# Patient Record
Sex: Female | Born: 1985
Health system: Southern US, Community
[De-identification: ages and names within clinical notes are randomized; demographics above are authoritative.]

---

## 2017-03-18 ENCOUNTER — Ambulatory Visit (INDEPENDENT_AMBULATORY_CARE_PROVIDER_SITE_OTHER): Payer: 59 | Admitting: Family Medicine

## 2017-03-18 ENCOUNTER — Encounter: Payer: Self-pay | Admitting: Family Medicine

## 2017-03-18 ENCOUNTER — Ambulatory Visit (INDEPENDENT_AMBULATORY_CARE_PROVIDER_SITE_OTHER): Payer: 59

## 2017-03-18 VITALS — BP 107/68 | HR 54 | Temp 98.4°F | Resp 17 | Ht 69.5 in | Wt 182.0 lb

## 2017-03-18 DIAGNOSIS — M7661 Achilles tendinitis, right leg: Secondary | ICD-10-CM | POA: Diagnosis not present

## 2017-03-18 DIAGNOSIS — H6122 Impacted cerumen, left ear: Secondary | ICD-10-CM | POA: Diagnosis not present

## 2017-03-18 DIAGNOSIS — N926 Irregular menstruation, unspecified: Secondary | ICD-10-CM | POA: Diagnosis not present

## 2017-03-18 DIAGNOSIS — Z131 Encounter for screening for diabetes mellitus: Secondary | ICD-10-CM

## 2017-03-18 DIAGNOSIS — Z13220 Encounter for screening for lipoid disorders: Secondary | ICD-10-CM

## 2017-03-18 DIAGNOSIS — Z114 Encounter for screening for human immunodeficiency virus [HIV]: Secondary | ICD-10-CM | POA: Diagnosis not present

## 2017-03-18 DIAGNOSIS — Z0000 Encounter for general adult medical examination without abnormal findings: Secondary | ICD-10-CM

## 2017-03-18 DIAGNOSIS — M79671 Pain in right foot: Secondary | ICD-10-CM | POA: Diagnosis not present

## 2017-03-18 LAB — POCT URINALYSIS DIP (MANUAL ENTRY)
BILIRUBIN UA: NEGATIVE
BILIRUBIN UA: NEGATIVE mg/dL
Blood, UA: NEGATIVE
Glucose, UA: NEGATIVE mg/dL
LEUKOCYTES UA: NEGATIVE
Nitrite, UA: NEGATIVE
PH UA: 6 (ref 5.0–8.0)
Protein Ur, POC: NEGATIVE mg/dL
Spec Grav, UA: <=1.005 — AB (ref 1.010–1.025)
Urobilinogen, UA: 0.2 E.U./dL

## 2017-03-18 MED ORDER — ETONOGESTREL-ETHINYL ESTRADIOL 0.12-0.015 MG/24HR VA RING: VAGINAL_RING | VAGINAL | 12 refills | Status: AC

## 2017-03-18 MED ORDER — MEDROXYPROGESTERONE ACETATE 10 MG PO TABS: 10.0000 mg | ORAL_TABLET | Freq: Every day | ORAL | 4 refills | Status: AC

## 2017-03-18 NOTE — Progress Notes (Signed)
Subjective:    Patient ID: Jessica Miller, female    DOB: 12/26/1985, 31 y.o.   MRN: [id number]  03/18/2017  Annual Exam   HPI This 31 y.o. female presents for Complete Physical Examination.     Last physical:  Not sure Pap smear:  [place] [facility] [place] [place]  Irregular menses; all over the place.  Last menses 02/2017 (2 days) 12/2016 (2 days), 11/2016 (2 days)    Menarche age 31.  Works out a lot; sporadic eating.  Had regular menses for four years.  Broke up with boyfriend and lost a lot of weight; no mense. OCP will regulate menses.  Provera works.  Has gained weight with birth control.  Did not mind Provera. Exercise regimen includes running 4 miles per day; weights and also crossfit; also teaches glasses; teaches group exercise.  Tries to get 20,000 steps per day.    R foot pain:  Achilles is about to kill pt.  Feels like about to rupture.  For two weeks has not been running. Started December 2018.  Talking to physical therapist; has tried everything.  This is last result.  Signed up to run half marathon in July.  Driving foot.  Really stiff every morning and then loosens up.   B:  3 hard boiled eggs, caneat them in the car.  L: salad, chicken, protein shakes D: tracks all food; chicken spaghetti, broccoli.  Ear congestion L: stopped up.  History of cerumen impaction.     Wt Readings from Last 3 Encounters:  03/31/17 179 lb (81.2 kg)  03/18/17 182 lb (82.6 kg)   BP Readings from Last 3 Encounters:  03/31/17 105/64  03/18/17 107/68    There is no immunization history on file for this patient.    Review of Systems  Constitutional: Negative for activity change, appetite change, chills, diaphoresis, fatigue, fever and unexpected weight change.  HENT: Positive for ear pain. Negative for congestion, dental problem, drooling, ear discharge, facial swelling, hearing loss, mouth sores, nosebleeds, postnasal drip, rhinorrhea, sinus pressure, sneezing, sore throat,  tinnitus, trouble swallowing and voice change.   Eyes: Negative for photophobia, pain, discharge, redness, itching and visual disturbance.  Respiratory: Negative for apnea, cough, choking, chest tightness, shortness of breath, wheezing and stridor.   Cardiovascular: Negative for chest pain, palpitations and leg swelling.  Gastrointestinal: Negative for abdominal distention, abdominal pain, anal bleeding, blood in stool, constipation, diarrhea, nausea, rectal pain and vomiting.  Endocrine: Negative for cold intolerance, heat intolerance, polydipsia, polyphagia and polyuria.  Genitourinary: Positive for menstrual problem. Negative for decreased urine volume, difficulty urinating, dyspareunia, dysuria, enuresis, flank pain, frequency, genital sores, hematuria, pelvic pain, urgency, vaginal bleeding, vaginal discharge and vaginal pain.  Musculoskeletal: Positive for arthralgias and gait problem. Negative for back pain, joint swelling, myalgias, neck pain and neck stiffness.  Skin: Negative for color change, pallor, rash and wound.  Allergic/Immunologic: Negative for environmental allergies, food allergies and immunocompromised state.  Neurological: Negative for dizziness, tremors, seizures, syncope, facial asymmetry, speech difficulty, weakness, light-headedness, numbness and headaches.  Hematological: Negative for adenopathy. Does not bruise/bleed easily.  Psychiatric/Behavioral: Negative for agitation, behavioral problems, confusion, decreased concentration, dysphoric mood, hallucinations, self-injury, sleep disturbance and suicidal ideas. The patient is nervous/anxious. The patient is not hyperactive.        Bedtime 10:00; up at 4:00am.    History reviewed. No pertinent past medical history. History reviewed. No pertinent surgical history. No Known Allergies  Social History   Social History  .  Marital status: Single    Spouse name: N/A  . Number of children: 0  . Years of education: N/A    Occupational History  . Not on file.   Social History Main Topics  . Smoking status: Never Smoker  . Smokeless tobacco: Never Used  . Alcohol use No  . Drug use: No  . Sexual activity: Not Currently   Other Topics Concern  . Not on file   Social History Narrative   Marital status: single; not dating       Children: none       Employment: physical fitness specialist at [employer]       Lives: with roommate      Tobacco: none      Alcohol: none      Drugs: none      Sexual activity: never; dates males      Exercise:  Teaches exercise classes at work; runs daily; Nurse, mental health.   Family History  Problem Relation Age of Onset  . Thyroid disease Mother   . Polycystic ovary syndrome Sister   . Hodgkin's lymphoma Paternal Grandmother   . Pancreatic cancer Paternal Grandfather        Objective:    BP 107/68 (BP Location: Right Arm, Patient Position: Sitting, Cuff Size: Normal)   Pulse (!) 54   Temp 98.4 F (36.9 C) (Oral)   Resp 17   Ht 5' 9.5" (1.765 m)   Wt 182 lb (82.6 kg)   LMP 02/07/2017   SpO2 100%   BMI 26.49 kg/m  Physical Exam  Constitutional: She is oriented to person, place, and time. She appears well-developed and well-nourished. No distress.  HENT:  Head: Normocephalic and atraumatic.  Right Ear: Tympanic membrane, external ear and ear canal normal.  Left Ear: External ear normal.  Nose: Nose normal.  Mouth/Throat: Oropharynx is clear and moist.  +cerumen L ear canal.  Eyes: Conjunctivae and EOM are normal. Pupils are equal, round, and reactive to light.  Neck: Normal range of motion and full passive range of motion without pain. Neck supple. No JVD present. Carotid bruit is not present. No thyromegaly present.  Cardiovascular: Normal rate, regular rhythm and normal heart sounds.  Exam reveals no gallop and no friction rub.   No murmur heard. Pulmonary/Chest: Effort normal and breath sounds normal. She has no wheezes. She has no rales. Right breast  exhibits no inverted nipple, no mass, no nipple discharge, no skin change and no tenderness. Left breast exhibits no mass, no nipple discharge, no skin change and no tenderness. Breasts are symmetrical.  Abdominal: Soft. Bowel sounds are normal. She exhibits no distension and no mass. There is no tenderness. There is no rebound and no guarding.  Musculoskeletal:       Right shoulder: Normal.       Left shoulder: Normal.       Right ankle: Achilles tendon exhibits pain. Achilles tendon exhibits no defect and normal Thompson's test results.       Cervical back: Normal.       Right foot: There is tenderness. There is normal range of motion, no bony tenderness, no swelling and normal capillary refill.  Lymphadenopathy:    She has no cervical adenopathy.  Neurological: She is alert and oriented to person, place, and time. She has normal reflexes. No cranial nerve deficit. She exhibits normal muscle tone. Coordination normal.  Skin: Skin is warm and dry. No rash noted. She is not diaphoretic. No erythema. No pallor.  Psychiatric: She has a normal mood and affect. Her behavior is normal. Judgment and thought content normal.  Nursing note and vitals reviewed.  Depression screen PHQ 2/9 03/18/2017  Decreased Interest 0  Down, Depressed, Hopeless 1  PHQ - 2 Score 1        Assessment & Plan:   1. Routine physical examination   2. Screening, lipid   3. Screening for diabetes mellitus   4. Screening for HIV (human immunodeficiency virus)   5. Menses, irregular   6. Achilles tendinitis of right lower extremity   7. Hearing loss due to cerumen impaction, left    -anticipatory guidance provided --- exercise, weight loss, safe driving practices, safe sexual practices. -obtain age appropriate screening labs and labs for chronic disease management. -new onset achilles tendonitis RIGHT: no improvement with rest, stretching, home exercise program; obtain R foot films; refer to sports medicine clinic due  to level of activity. -having amenorrhea in athlete with excessive exercise; expressed concern to patient; obtain labs; recommend decreasing exercise; suggested counseling and pt declined.  Denies anorexia or bulimia. -s/p LEFT ear irrigation. -irregular menses secondary to excessive exercise; obtain labs; recommend Provera monthly or Nuvaring; pt to consider.   Orders Placed This Encounter  Procedures  . DG Foot Complete Right    Standing Status:   Future    Number of Occurrences:   1    Standing Expiration Date:   03/18/2018    Order Specific Question:   Reason for Exam (SYMPTOM  OR DIAGNOSIS REQUIRED)    Answer:   R achilles pain    Order Specific Question:   Is the patient pregnant?    Answer:   No    Order Specific Question:   Preferred imaging location?    Answer:   External  . CBC with Differential/Platelet  . Comprehensive metabolic panel    Order Specific Question:   Has the patient fasted?    Answer:   Yes  . Hemoglobin A1c  . Lipid panel    Order Specific Question:   Has the patient fasted?    Answer:   Yes  . TSH  . Vitamin B12  . VITAMIN D 25 Hydroxy (Vit-D Deficiency, Fractures)  . Ambulatory referral to Sports Medicine    Referral Priority:   Routine    Referral Type:   Consultation    Number of Visits Requested:   1  . Ear wax removal    LEFT  . POCT urinalysis dipstick  . EKG 12-Lead   Meds ordered this encounter  Medications  . Multiple Vitamin (MULTI-VITAMINS) TABS    Sig: Take by mouth.  . DISCONTD: DOCOSAHEXAENOIC ACID PO    Sig: Take 1 g by mouth.  . Omega-3 Fatty Acids (FISH OIL CONCENTRATE PO)    Sig: Take by mouth daily.  . medroxyPROGESTERone (PROVERA) 10 MG tablet    Sig: Take 1 tablet (10 mg total) by mouth daily. For ten days    Dispense:  30 tablet    Refill:  4  . etonogestrel-ethinyl estradiol (NUVARING) 0.12-0.015 MG/24HR vaginal ring    Sig: Insert vaginally and leave in place for 3 consecutive weeks, then remove for 1 week.     Dispense:  1 each    Refill:  12    No Follow-up on file.   Tura Roller Paulita Fujita, M.D. Primary [facility] at [facility] [address] [place], [place]  [address] [phone] phone [phone]  fax

## 2017-03-18 NOTE — Patient Instructions (Addendum)
IF you received an x-ray today, you will receive an invoice from [facility]. Please contact [facility] at [phone] with questions or concerns regarding your invoice.   IF you received labwork today, you will receive an invoice from [facility]. Please contact [facility] at [phone] with questions or concerns regarding your invoice.   Our billing staff will not be able to assist you with questions regarding bills from these companies.  You will be contacted with the lab results as soon as they are available. The fastest way to get your results is to activate your My Chart account. Instructions are located on the last page of this paperwork. If you have not heard from [place] regarding the results in 2 weeks, please contact this office.      Achilles Tendinitis Rehab Ask your health care provider which exercises are safe for you. Do exercises exactly as told by your health care provider and adjust them as directed. It is normal to feel mild stretching, pulling, tightness, or discomfort as you do these exercises, but you should stop right away if you feel sudden pain or your pain gets worse. Do not begin these exercises until told by your health care provider. Stretching and range of motion exercises These exercises warm up your muscles and joints and improve the movement and flexibility of your ankle. These exercises also help to relieve pain, numbness, and tingling. Exercise A: Standing wall calf stretch, knee straight   1. Stand with your hands against a wall. 2. Extend your __________ leg behind you and bend your front knee slightly. Keep both of your heels on the floor. 3. Point the toes of your back foot slightly inward. 4. Keeping your heels on the floor and your back knee straight, shift your weight toward the wall. Do not allow your back to arch. You should feel a gentle stretch in your calf. 5. Hold this position for seconds. Repeat __________ times. Complete  this stretch __________ times per day. Exercise B: Standing wall calf stretch, knee bent  1. Stand with your hands against a wall. 2. Extend your __________ leg behind you, and bend your front knee slightly. Keep both of your heels on the floor. 3. Point the toes of your back foot slightly inward. 4. Keeping your heels on the floor, unlock your back knee so that it is bent. You should feel a gentle stretch deep in your calf. 5. Hold this position for __________ seconds. Repeat __________ times. Complete this stretch __________ times per day. Strengthening exercises These exercises build strength and control of your ankle. Endurance is the ability to use your muscles for a long time, even after they get tired. Exercise C: Plantar flexion with band   1. Sit on the floor with your __________ leg extended. You may put a pillow under your calf to give your foot more room to move. 2. Loop a rubber exercise band or tube around the ball of your __________ foot. The ball of your foot is on the walking surface, right under your toes. The band or tube should be slightly tense when your foot is relaxed. If the band or tube slips, you can put on your shoe or put a washcloth between the band and your foot to help it stay in place. 3. Slowly point your toes downward, pushing them away from you. 4. Hold this position for __________ seconds. 5. Slowly release the tension in the band or tube, controlling smoothly until your foot is back  to the starting position. Repeat __________ times. Complete this exercise __________ times per day. Exercise D: Heel raise with eccentric lower   1. Stand on a step with the balls of your feet. The ball of your foot is on the walking surface, right under your toes.  Do not put your heels on the step.  For balance, rest your hands on the wall or on a railing. 2. Rise up onto the balls of your feet. 3. Keeping your heels up, shift all of your weight to your __________ leg and  pick up your other leg. 4. Slowly lower your __________ leg so your heel drops below the level of the step. 5. Put down your foot. If told by your health care provider, build up to:  3 sets of 15 repetitions while keeping your knees straight.  3 sets of 15 repetitions while keeping your knees bent as far as told by your health care provider. Complete this exercise __________ times per day. If this exercise is too easy, try doing it while wearing a backpack with weights in it. Balance exercises These exercises improve or maintain your balance. Balance is important in preventing falls. Exercise E: Single leg stand  1. Without shoes, stand near a railing or in a door frame. Hold on to the railing or door frame as needed. 2. Stand on your __________ foot. Keep your big toe down on the floor and try to keep your arch lifted. 3. Hold this position for __________ seconds. Repeat __________ times. Complete this exercise __________ times per day. If this exercise is too easy, you can try it with your eyes closed or while standing on a pillow. This information is not intended to replace advice given to you by your health care provider. Make sure you discuss any questions you have with your health care provider. Document Released: 05/27/2005 Document Revised: 07/02/2016 Document Reviewed: 07/02/2015 Elsevier Interactive Patient Education  2017 [employer]

## 2017-03-19 LAB — VITAMIN B12: VITAMIN B 12: 630 pg/mL (ref 232–1245)

## 2017-03-19 LAB — COMPREHENSIVE METABOLIC PANEL
A/G RATIO: 1.8 (ref 1.2–2.2)
ALBUMIN: 4.7 g/dL (ref 3.5–5.5)
ALT: 13 IU/L (ref 0–32)
AST: 22 IU/L (ref 0–40)
Alkaline Phosphatase: 60 IU/L (ref 39–117)
BILIRUBIN TOTAL: 0.5 mg/dL (ref 0.0–1.2)
BUN / CREAT RATIO: 12 (ref 9–23)
BUN: 10 mg/dL (ref 6–20)
CHLORIDE: 101 mmol/L (ref 96–106)
CO2: 26 mmol/L (ref 18–29)
Calcium: 9.5 mg/dL (ref 8.7–10.2)
Creatinine, Ser: 0.85 mg/dL (ref 0.57–1.00)
GFR calc non Af Amer: 93 mL/min/{1.73_m2} (ref >59)
GFR, EST AFRICAN AMERICAN: 107 mL/min/{1.73_m2} (ref >59)
Globulin, Total: 2.6 g/dL (ref 1.5–4.5)
Glucose: 90 mg/dL (ref 65–99)
POTASSIUM: 5.2 mmol/L (ref 3.5–5.2)
Sodium: 140 mmol/L (ref 134–144)
TOTAL PROTEIN: 7.3 g/dL (ref 6.0–8.5)

## 2017-03-19 LAB — CBC WITH DIFFERENTIAL/PLATELET
BASOS: 0 %
Basophils Absolute: 0 10*3/uL (ref 0.0–0.2)
EOS (ABSOLUTE): 0.1 10*3/uL (ref 0.0–0.4)
Eos: 1 %
HEMOGLOBIN: 12.3 g/dL (ref 11.1–15.9)
Hematocrit: 37 % (ref 34.0–46.6)
Immature Grans (Abs): 0 10*3/uL (ref 0.0–0.1)
Immature Granulocytes: 0 %
LYMPHS: 39 %
Lymphocytes Absolute: 1.7 10*3/uL (ref 0.7–3.1)
MCH: 29.9 pg (ref 26.6–33.0)
MCHC: 33.2 g/dL (ref 31.5–35.7)
MCV: 90 fL (ref 79–97)
MONOCYTES: 6 %
Monocytes Absolute: 0.3 10*3/uL (ref 0.1–0.9)
NEUTROS ABS: 2.4 10*3/uL (ref 1.4–7.0)
Neutrophils: 54 %
Platelets: 268 10*3/uL (ref 150–379)
RBC: 4.12 x10E6/uL (ref 3.77–5.28)
RDW: 12.9 % (ref 12.3–15.4)
WBC: 4.5 10*3/uL (ref 3.4–10.8)

## 2017-03-19 LAB — HEMOGLOBIN A1C
Est. average glucose Bld gHb Est-mCnc: 103 mg/dL
Hgb A1c MFr Bld: 5.2 % (ref 4.8–5.6)

## 2017-03-19 LAB — LIPID PANEL
CHOL/HDL RATIO: 2.6 ratio (ref 0.0–4.4)
Cholesterol, Total: 214 mg/dL — ABNORMAL HIGH (ref 100–199)
HDL: 82 mg/dL (ref >39)
LDL CALC: 120 mg/dL — AB (ref 0–99)
Triglycerides: 59 mg/dL (ref 0–149)
VLDL Cholesterol Cal: 12 mg/dL (ref 5–40)

## 2017-03-19 LAB — VITAMIN D 25 HYDROXY (VIT D DEFICIENCY, FRACTURES): Vit D, 25-Hydroxy: 36.1 ng/mL (ref 30.0–100.0)

## 2017-03-19 LAB — TSH: TSH: 1.64 u[IU]/mL (ref 0.450–4.500)

## 2017-03-31 ENCOUNTER — Ambulatory Visit: Payer: Self-pay

## 2017-03-31 ENCOUNTER — Encounter: Payer: Self-pay | Admitting: Student

## 2017-03-31 ENCOUNTER — Ambulatory Visit (INDEPENDENT_AMBULATORY_CARE_PROVIDER_SITE_OTHER): Payer: 59 | Admitting: Student

## 2017-03-31 VITALS — BP 105/64 | Ht 70.0 in | Wt 179.0 lb

## 2017-03-31 DIAGNOSIS — M7660 Achilles tendinitis, unspecified leg: Secondary | ICD-10-CM

## 2017-03-31 DIAGNOSIS — S86011A Strain of right Achilles tendon, initial encounter: Secondary | ICD-10-CM

## 2017-03-31 MED ORDER — NITROGLYCERIN 0.2 MG/HR TD PT24: MEDICATED_PATCH | TRANSDERMAL | 1 refills | Status: AC

## 2017-03-31 MED FILL — NITROGLYCERIN 0.2 MG/HR PTC: 0.2 | 90 days supply | Qty: 22 | Fill #0

## 2017-03-31 NOTE — Progress Notes (Signed)
  Jessica Miller - 31 y.o. female MRN [id number]  Date of birth: 09/04/1986  SUBJECTIVE:  Including CC & ROS.  CC: right achilles pain  [place] presents with right Achilles pain that has been ongoing since December.  She is trying to train for a half marathon but has pain when she runs.  She has some physical therapist friend who recommended exercises. She reports mild improvement but really not that much. 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 She also has done cross fit.  She would like to know what to do so that and potentially run this half-marathon in July. She denies any acute injuries. Denies any numbness or tingling distally.   ROS: No unexpected weight loss, fever, chills, swelling, instability, muscle pain, numbness/tingling, redness, otherwise see HPI   PMHx - Updated and reviewed.  Contributory factors include: Negative PSHx - Updated and reviewed.  Contributory factors include:  Negative FHx - Updated and reviewed.  Contributory factors include:  Negative Social Hx - Updated and reviewed. 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 Sensation: intact Vascular: intact w/ dorsalis pedis & posterior tibialis pulses 2+ Stable lateral and medial ligaments; Negative Anterior drawer test  Limited ultrasound of right Achilles Achilles tendon viewed and long and short axis which shows partial tearing making up 15% of tendon No complete tear is seen Increased blood flow was seen to the partial tear Achilles tendon measure 0.6 cm 2 cm from the origin  Findings consistent with partial tear of right Achilles tendon with healing  Ultrasound performed and interpreted by Cardell PeachAlicia Dyasia Firestine, DO   ASSESSMENT & PLAN:   Partial Achilles tendon tear, left, initial encounter Recommended continuing home exercise program with [employer] and stretching Nitroglycerin patches daily Heel lifts given to current inserts Follow-up 3 weeks Recommend rest from running and ballistics for 3 weeks

## 2017-03-31 NOTE — Assessment & Plan Note (Signed)
Recommended continuing home exercise program with [employer] and stretching Nitroglycerin patches daily Heel lifts given to current inserts Follow-up 3 weeks Recommend rest from running and ballistics for 3 weeks

## 2017-04-05 ENCOUNTER — Encounter: Payer: Self-pay | Admitting: Family Medicine

## 2017-04-21 ENCOUNTER — Ambulatory Visit: Payer: 59 | Admitting: Student

## 2017-04-28 ENCOUNTER — Ambulatory Visit: Payer: 59 | Admitting: Student

## 2017-10-28 ENCOUNTER — Other Ambulatory Visit (HOSPITAL_COMMUNITY): Payer: Self-pay | Admitting: Research Study

## 2017-10-28 DIAGNOSIS — Z006 Encounter for examination for normal comparison and control in clinical research program: Secondary | ICD-10-CM

## 2017-10-29 ENCOUNTER — Encounter (HOSPITAL_COMMUNITY): Payer: Self-pay

## 2017-10-29 ENCOUNTER — Ambulatory Visit (HOSPITAL_COMMUNITY): Payer: 59

## 2018-03-25 ENCOUNTER — Encounter: Payer: Self-pay | Admitting: Family Medicine

## 2018-03-30 ENCOUNTER — Encounter: Payer: Self-pay | Admitting: Family Medicine

## 2018-10-20 IMAGING — DX DG FOOT COMPLETE 3+V*R*
3 series · 3 of 3 positions shown · non-contrast
Comparison: None.

CLINICAL DATA: Right-sided Achilles pain.

EXAM:
RIGHT FOOT COMPLETE - 3+ VIEW

[foot ap]
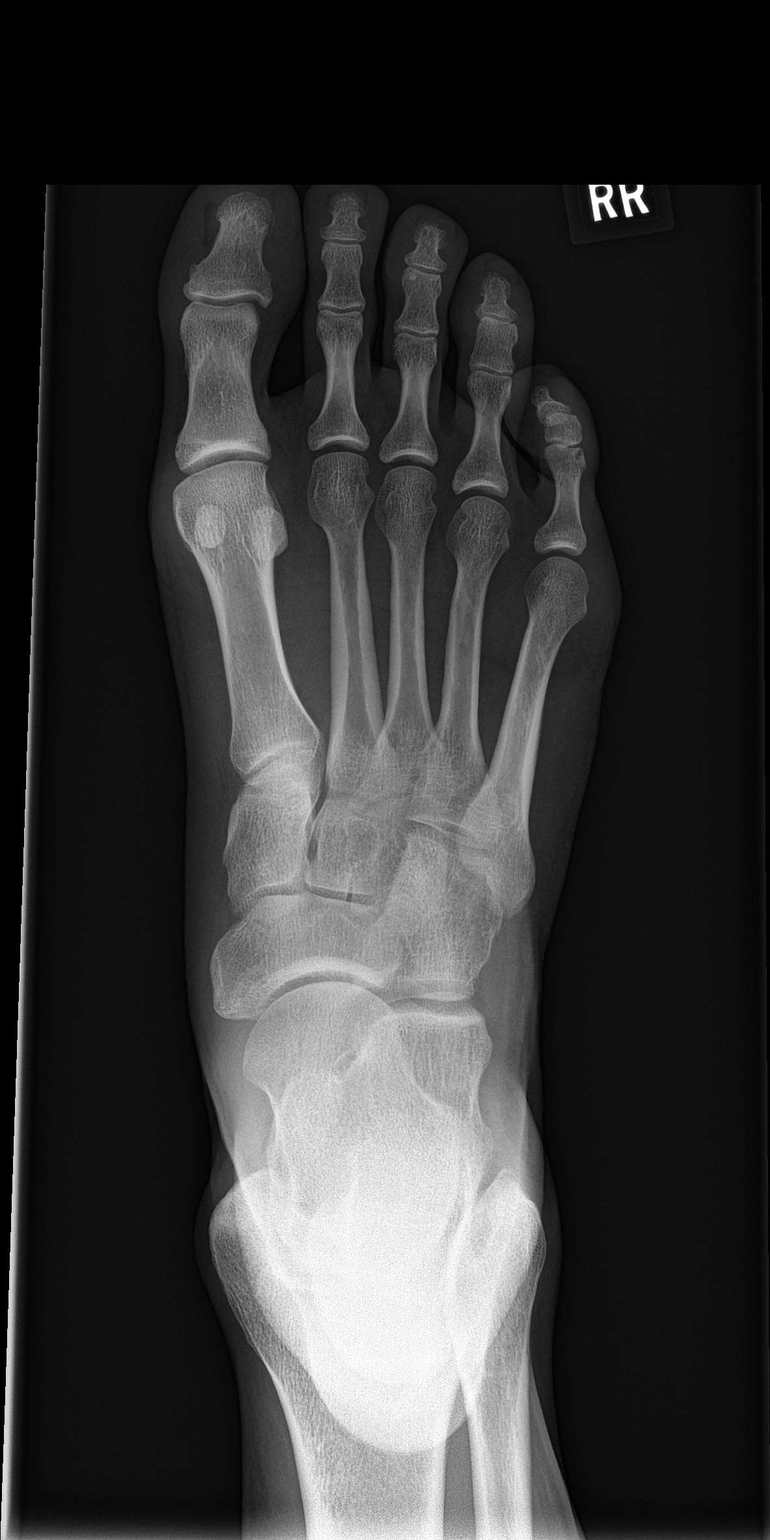

[foot obl]
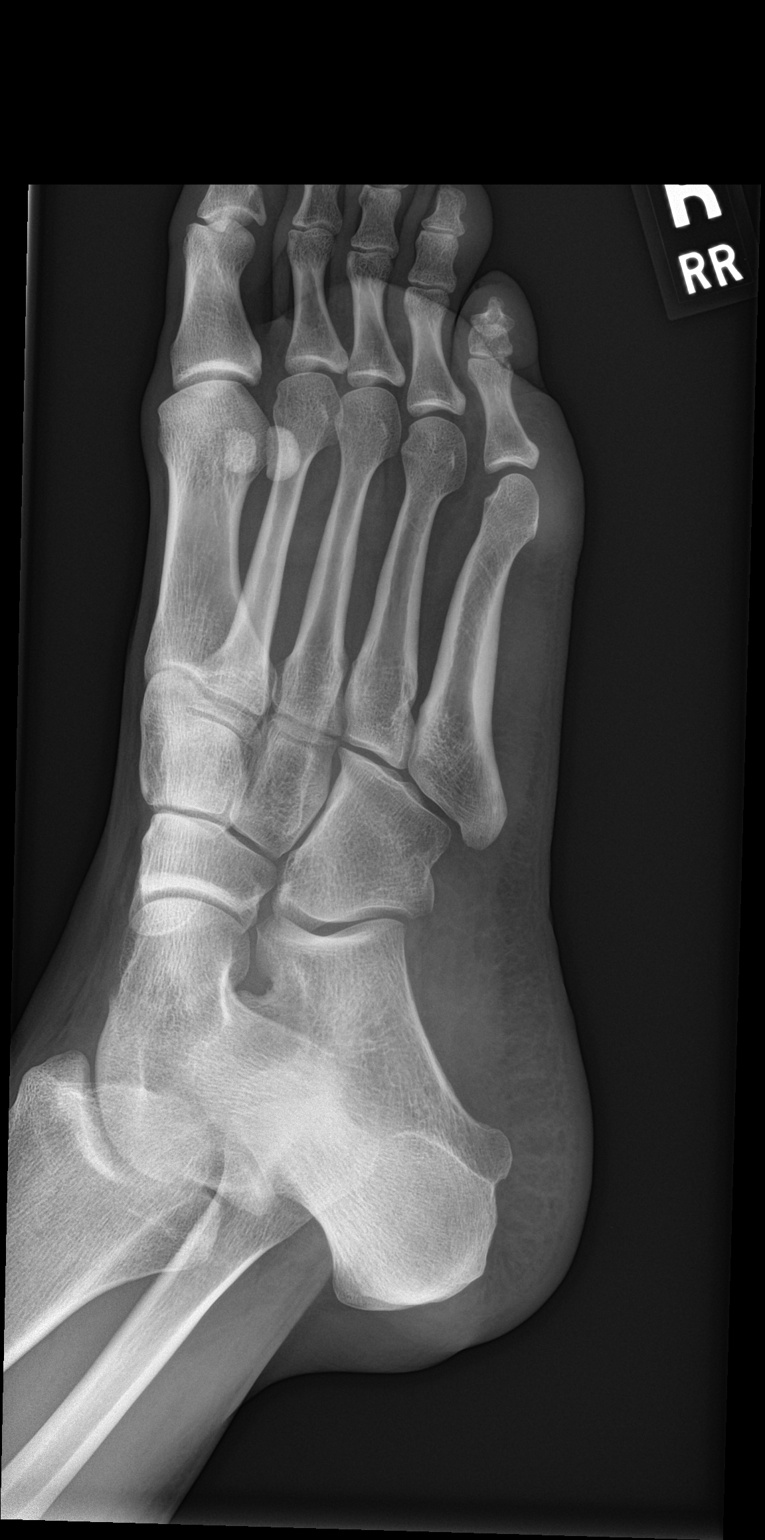

[foot lat]
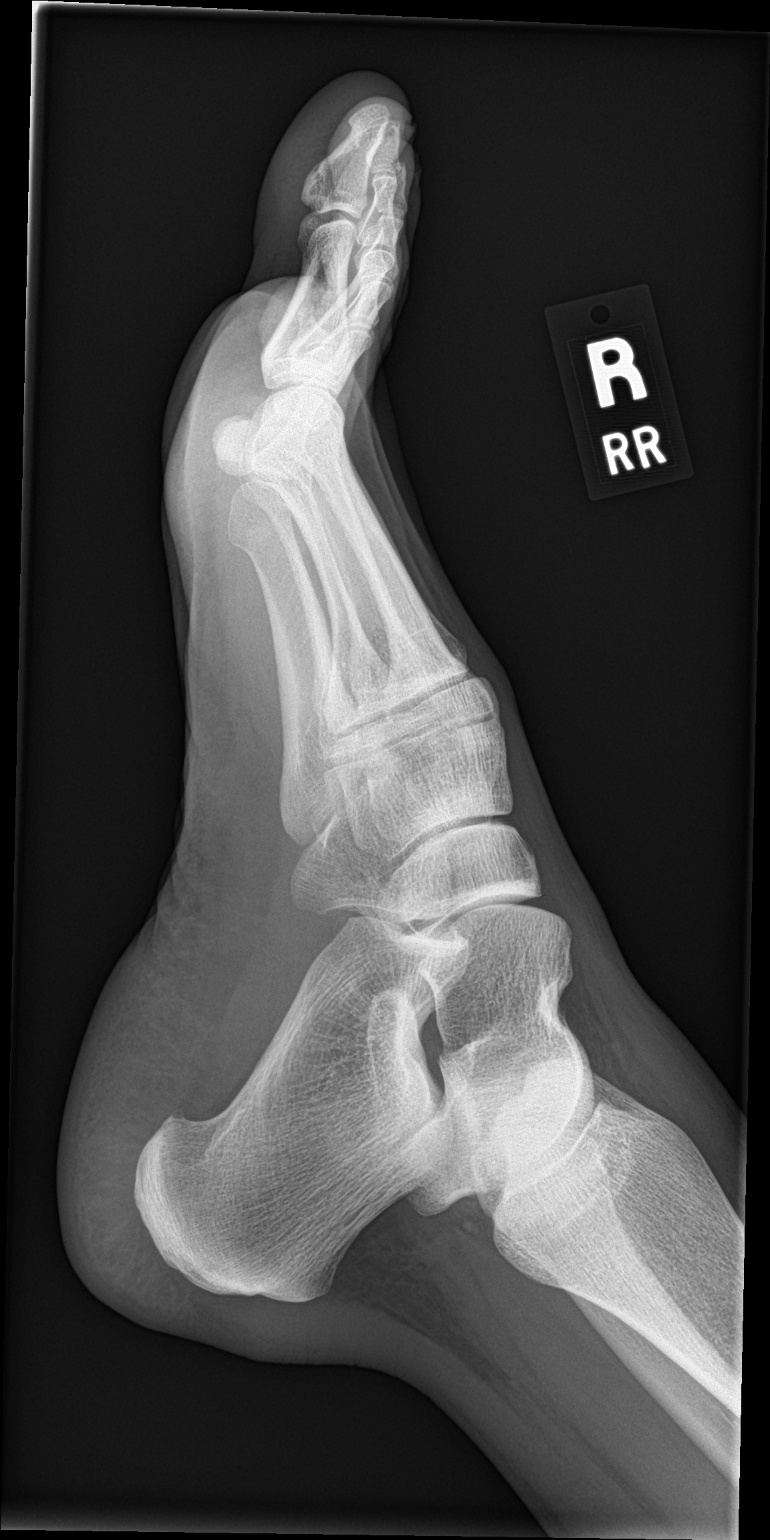

[3 of 3 positions shown; findings below may reference images not displayed]

FINDINGS: There is no evidence of fracture or dislocation. There is no
evidence of arthropathy or other focal bone abnormality. Soft
tissues are unremarkable.
IMPRESSION: Negative.

## 2019-05-05 DIAGNOSIS — Z124 Encounter for screening for malignant neoplasm of cervix: Secondary | ICD-10-CM | POA: Diagnosis not present

## 2019-05-05 DIAGNOSIS — Z01419 Encounter for gynecological examination (general) (routine) without abnormal findings: Secondary | ICD-10-CM | POA: Diagnosis not present

## 2020-02-02 DIAGNOSIS — E349 Endocrine disorder, unspecified: Secondary | ICD-10-CM | POA: Diagnosis not present

## 2020-02-02 DIAGNOSIS — R635 Abnormal weight gain: Secondary | ICD-10-CM | POA: Diagnosis not present

## 2020-02-02 DIAGNOSIS — R6882 Decreased libido: Secondary | ICD-10-CM | POA: Diagnosis not present

## 2020-02-02 DIAGNOSIS — E538 Deficiency of other specified B group vitamins: Secondary | ICD-10-CM | POA: Diagnosis not present

## 2020-02-02 DIAGNOSIS — R5383 Other fatigue: Secondary | ICD-10-CM | POA: Diagnosis not present

## 2020-02-02 DIAGNOSIS — G4700 Insomnia, unspecified: Secondary | ICD-10-CM | POA: Diagnosis not present

## 2020-02-02 DIAGNOSIS — M2550 Pain in unspecified joint: Secondary | ICD-10-CM | POA: Diagnosis not present

## 2020-02-02 DIAGNOSIS — R61 Generalized hyperhidrosis: Secondary | ICD-10-CM | POA: Diagnosis not present

## 2020-02-02 DIAGNOSIS — R413 Other amnesia: Secondary | ICD-10-CM | POA: Diagnosis not present

## 2020-02-19 DIAGNOSIS — Z03818 Encounter for observation for suspected exposure to other biological agents ruled out: Secondary | ICD-10-CM | POA: Diagnosis not present

## 2020-02-19 DIAGNOSIS — Z20828 Contact with and (suspected) exposure to other viral communicable diseases: Secondary | ICD-10-CM | POA: Diagnosis not present

## 2020-05-08 DIAGNOSIS — M542 Cervicalgia: Secondary | ICD-10-CM | POA: Diagnosis not present

## 2020-05-08 DIAGNOSIS — M9902 Segmental and somatic dysfunction of thoracic region: Secondary | ICD-10-CM | POA: Diagnosis not present

## 2020-05-08 DIAGNOSIS — M9901 Segmental and somatic dysfunction of cervical region: Secondary | ICD-10-CM | POA: Diagnosis not present

## 2020-05-08 DIAGNOSIS — M26601 Right temporomandibular joint disorder, unspecified: Secondary | ICD-10-CM | POA: Diagnosis not present

## 2020-06-21 DIAGNOSIS — Z01419 Encounter for gynecological examination (general) (routine) without abnormal findings: Secondary | ICD-10-CM | POA: Diagnosis not present

## 2020-06-21 DIAGNOSIS — Z124 Encounter for screening for malignant neoplasm of cervix: Secondary | ICD-10-CM | POA: Diagnosis not present

## 2021-03-28 DIAGNOSIS — M9901 Segmental and somatic dysfunction of cervical region: Secondary | ICD-10-CM | POA: Diagnosis not present

## 2021-03-28 DIAGNOSIS — M26601 Right temporomandibular joint disorder, unspecified: Secondary | ICD-10-CM | POA: Diagnosis not present

## 2021-03-28 DIAGNOSIS — M542 Cervicalgia: Secondary | ICD-10-CM | POA: Diagnosis not present

## 2021-03-28 DIAGNOSIS — M9902 Segmental and somatic dysfunction of thoracic region: Secondary | ICD-10-CM | POA: Diagnosis not present

## 2021-04-02 DIAGNOSIS — M50322 Other cervical disc degeneration at C5-C6 level: Secondary | ICD-10-CM | POA: Diagnosis not present

## 2021-04-02 DIAGNOSIS — M9903 Segmental and somatic dysfunction of lumbar region: Secondary | ICD-10-CM | POA: Diagnosis not present

## 2021-04-02 DIAGNOSIS — M5137 Other intervertebral disc degeneration, lumbosacral region: Secondary | ICD-10-CM | POA: Diagnosis not present

## 2021-04-02 DIAGNOSIS — M9901 Segmental and somatic dysfunction of cervical region: Secondary | ICD-10-CM | POA: Diagnosis not present

## 2023-02-01 ENCOUNTER — Ambulatory Visit (INDEPENDENT_AMBULATORY_CARE_PROVIDER_SITE_OTHER): Payer: 59 | Admitting: Mental Health
